# Patient Record
Sex: Male | Born: 1960 | Race: White | Hispanic: No | Marital: Single | Smoking: Never smoker
Health system: Southern US, Community
[De-identification: ages and names within clinical notes are randomized; demographics above are authoritative.]

## PROBLEM LIST (undated history)

## (undated) DIAGNOSIS — I10 Essential (primary) hypertension: Secondary | ICD-10-CM

## (undated) DIAGNOSIS — K219 Gastro-esophageal reflux disease without esophagitis: Secondary | ICD-10-CM

## (undated) DIAGNOSIS — J8489 Other specified interstitial pulmonary diseases: Secondary | ICD-10-CM

## (undated) DIAGNOSIS — J449 Chronic obstructive pulmonary disease, unspecified: Secondary | ICD-10-CM

## (undated) HISTORY — PX: CHOLECYSTECTOMY: SHX55

---

## 2017-06-17 ENCOUNTER — Encounter (HOSPITAL_BASED_OUTPATIENT_CLINIC_OR_DEPARTMENT_OTHER): Payer: Self-pay | Admitting: Emergency Medicine

## 2017-06-17 ENCOUNTER — Emergency Department (HOSPITAL_BASED_OUTPATIENT_CLINIC_OR_DEPARTMENT_OTHER)
Admission: EM | Admit: 2017-06-17 | Discharge: 2017-06-17 | Disposition: A | Discharge status: Home / Self Care | Payer: Self-pay | Attending: Emergency Medicine | Admitting: Emergency Medicine

## 2017-06-17 ENCOUNTER — Emergency Department (HOSPITAL_BASED_OUTPATIENT_CLINIC_OR_DEPARTMENT_OTHER): Payer: Self-pay

## 2017-06-17 DIAGNOSIS — R0789 Other chest pain: Secondary | ICD-10-CM | POA: Insufficient documentation

## 2017-06-17 DIAGNOSIS — I10 Essential (primary) hypertension: Secondary | ICD-10-CM | POA: Insufficient documentation

## 2017-06-17 DIAGNOSIS — J449 Chronic obstructive pulmonary disease, unspecified: Secondary | ICD-10-CM | POA: Insufficient documentation

## 2017-06-17 HISTORY — DX: Chronic obstructive pulmonary disease, unspecified: J44.9

## 2017-06-17 HISTORY — DX: Other specified interstitial pulmonary diseases: J84.89

## 2017-06-17 HISTORY — DX: Essential (primary) hypertension: I10

## 2017-06-17 HISTORY — DX: Gastro-esophageal reflux disease without esophagitis: K21.9

## 2017-06-17 LAB — BASIC METABOLIC PANEL
Anion gap: 9 (ref 5–15)
BUN: 18 mg/dL (ref 6–20)
CHLORIDE: 105 mmol/L (ref 101–111)
CO2: 26 mmol/L (ref 22–32)
Calcium: 9.6 mg/dL (ref 8.9–10.3)
Creatinine, Ser: 1.07 mg/dL (ref 0.61–1.24)
GFR calc Af Amer: >60 mL/min (ref >60)
GFR calc non Af Amer: >60 mL/min (ref >60)
Glucose, Bld: 111 mg/dL — ABNORMAL HIGH (ref 65–99)
Potassium: 3.6 mmol/L (ref 3.5–5.1)
SODIUM: 140 mmol/L (ref 135–145)

## 2017-06-17 LAB — CBC
HCT: 40.5 % (ref 39.0–52.0)
Hemoglobin: 13.1 g/dL (ref 13.0–17.0)
MCH: 30.6 pg (ref 26.0–34.0)
MCHC: 32.3 g/dL (ref 30.0–36.0)
MCV: 94.6 fL (ref 78.0–100.0)
Platelets: 297 10*3/uL (ref 150–400)
RBC: 4.28 MIL/uL (ref 4.22–5.81)
RDW: 14 % (ref 11.5–15.5)
WBC: 11.9 10*3/uL — AB (ref 4.0–10.5)

## 2017-06-17 LAB — TROPONIN I
Troponin I: <0.03 ng/mL (ref <0.03)
Troponin I: <0.03 ng/mL (ref <0.03)

## 2017-06-17 NOTE — Discharge Instructions (Signed)
Please follow up with your doctor °Return for worsening symptoms °

## 2017-06-17 NOTE — ED Notes (Signed)
Patient transported to X-ray 

## 2017-06-17 NOTE — ED Triage Notes (Signed)
Patient reports chest pain that began 30 minutes PTA.  Reports the pain as sharp in the middle of his chest.  States that he was sitting on the cough when this occurred.  Denies nausea, shortness of breath.

## 2017-06-17 NOTE — ED Notes (Signed)
Pt is unsure of what medications he takes at home, "five pills, I can't remember the names..." pt wife states she will go home and get pt medications after they talk to md.

## 2017-06-17 NOTE — ED Provider Notes (Signed)
[facility]   CSN: [id number] Arrival date & time: 06/17/17  1753     History   Chief Complaint Chief Complaint  Patient presents with  . Chest Pain    HPI Kyle Sosa is a 56 y.o. male who presents with chest pain. PMH significant for COPD, GERD, HTN, hx of BOOP, hypothyrodism. He states that he had an acute onset of chest pain today at 5:30PM. He states that he was sitting on the couch when this occurred. It is sharp, non-radiating, and over the left side of his chest. He has had chest pain before but never this severe. He denies fever, chills, syncope, palpitations, leg swelling, SOB, abdominal pain, N/V. He does have occasional cough and is on chronic steroids. He denies hx of cigarette smoking or drug use.  HPI  Past Medical History:  Diagnosis Date  . BOOP (bronchiolitis obliterans with organizing pneumonia) (HCC)   . COPD (chronic obstructive pulmonary disease) (HCC)   . GERD (gastroesophageal reflux disease)   . Hypertension     There are no active problems to display for this patient.   Past Surgical History:  Procedure Laterality Date  . CHOLECYSTECTOMY         Home Medications    Prior to Admission medications   Not on File    Family History History reviewed. No pertinent family history.  Social History Social History  Substance Use Topics  . Smoking status: Never Smoker  . Smokeless tobacco: Never Used  . Alcohol use No     Allergies   Patient has no known allergies.   Review of Systems Review of Systems  Constitutional: Negative for chills and fever.  Respiratory: Positive for cough and wheezing. Negative for shortness of breath.   Cardiovascular: Positive for chest pain. Negative for palpitations and leg swelling.  Gastrointestinal: Negative for abdominal pain, nausea and vomiting.  Neurological: Negative for syncope, weakness and light-headedness.  All other systems reviewed and are  negative.    Physical Exam Updated Vital Signs BP 139/88 (BP Location: Right Arm)   Pulse 87   Temp (!) 97.1 F (36.2 C) (Oral)   Resp 18   Ht  (1.88 m)   Wt 99.8 kg (220 lb)   SpO2 100%   BMI 28.25 kg/m   Physical Exam  Constitutional: He is oriented to person, place, and time. He appears well-developed and well-nourished. No distress.  HENT:  Head: Normocephalic and atraumatic.  Eyes: Pupils are equal, round, and reactive to light. Conjunctivae are normal. Right eye exhibits no discharge. Left eye exhibits no discharge. No scleral icterus.  Neck: Normal range of motion.  Cardiovascular: Normal rate and regular rhythm.  Exam reveals no gallop and no friction rub.   No murmur heard. Pulmonary/Chest: Effort normal and breath sounds normal. No respiratory distress. He has no wheezes. He has no rales. He exhibits tenderness (left sided chest wall tenderness).  Abdominal: Soft. Bowel sounds are normal. He exhibits no distension and no mass. There is no tenderness. There is no rebound and no guarding. No hernia.  Neurological: He is alert and oriented to person, place, and time.  Skin: Skin is warm and dry.  Psychiatric: He has a normal mood and affect. His behavior is normal.  Nursing note and vitals reviewed.    ED Treatments / Results  Labs (all labs ordered are listed, but only abnormal results are displayed) Labs Reviewed  BASIC METABOLIC PANEL - Abnormal; Notable for the following:  Result Value   Glucose, Bld 111 (*)    All other components within normal limits  CBC - Abnormal; Notable for the following:    WBC 11.9 (*)    All other components within normal limits  TROPONIN I    EKG  EKG Interpretation  Date/Time:  Wednesday June 17 2017 17:58:04 EDT Ventricular Rate:  88 PR Interval:    QRS Duration: 86 QT Interval:  378 QTC Calculation: 458 R Axis:   58 Text Interpretation:  Sinus rhythm Abnormal R-wave progression, early transition No old  tracing to compare Confirmed by Pricilla Loveless [phone]) on 06/17/2017 6:13:39 PM       Radiology No results found.  Procedures Procedures (including critical care time)  Medications Ordered in ED Medications - No data to display   Initial Impression / Assessment and Plan / ED Course  I have reviewed the triage vital signs and the nursing notes.  Pertinent labs & imaging results that were available during my care of the patient were reviewed by me and considered in my medical decision making (see chart for details).  Chest pain work up is reassuring. Doubt ACS, PE, pericarditis, esophageal rupture, tension pneumothorax, aortic dissection, cardiac tamponade. EKG is NSR. CXR is negative. Initial and second troponin is 0. Labs are unremarkable. Patient is non-smoker. HEART score is 2. Pain is resolved. Advised to f/u with PCP and return for worsening symptoms.  Final Clinical Impressions(s) / ED Diagnoses   Final diagnoses:  Chest wall pain    New Prescriptions New Prescriptions   No medications on file     Beryle Quant 06/18/17 0009    Pricilla Loveless, MD 06/18/17 [phone]

## 2018-04-02 IMAGING — CR DG CHEST 2V
2 series · 2 of 2 positions shown · non-contrast
Comparison: None.

CLINICAL DATA: Chest pain

EXAM:
CHEST  2 VIEW

[w chest pa]
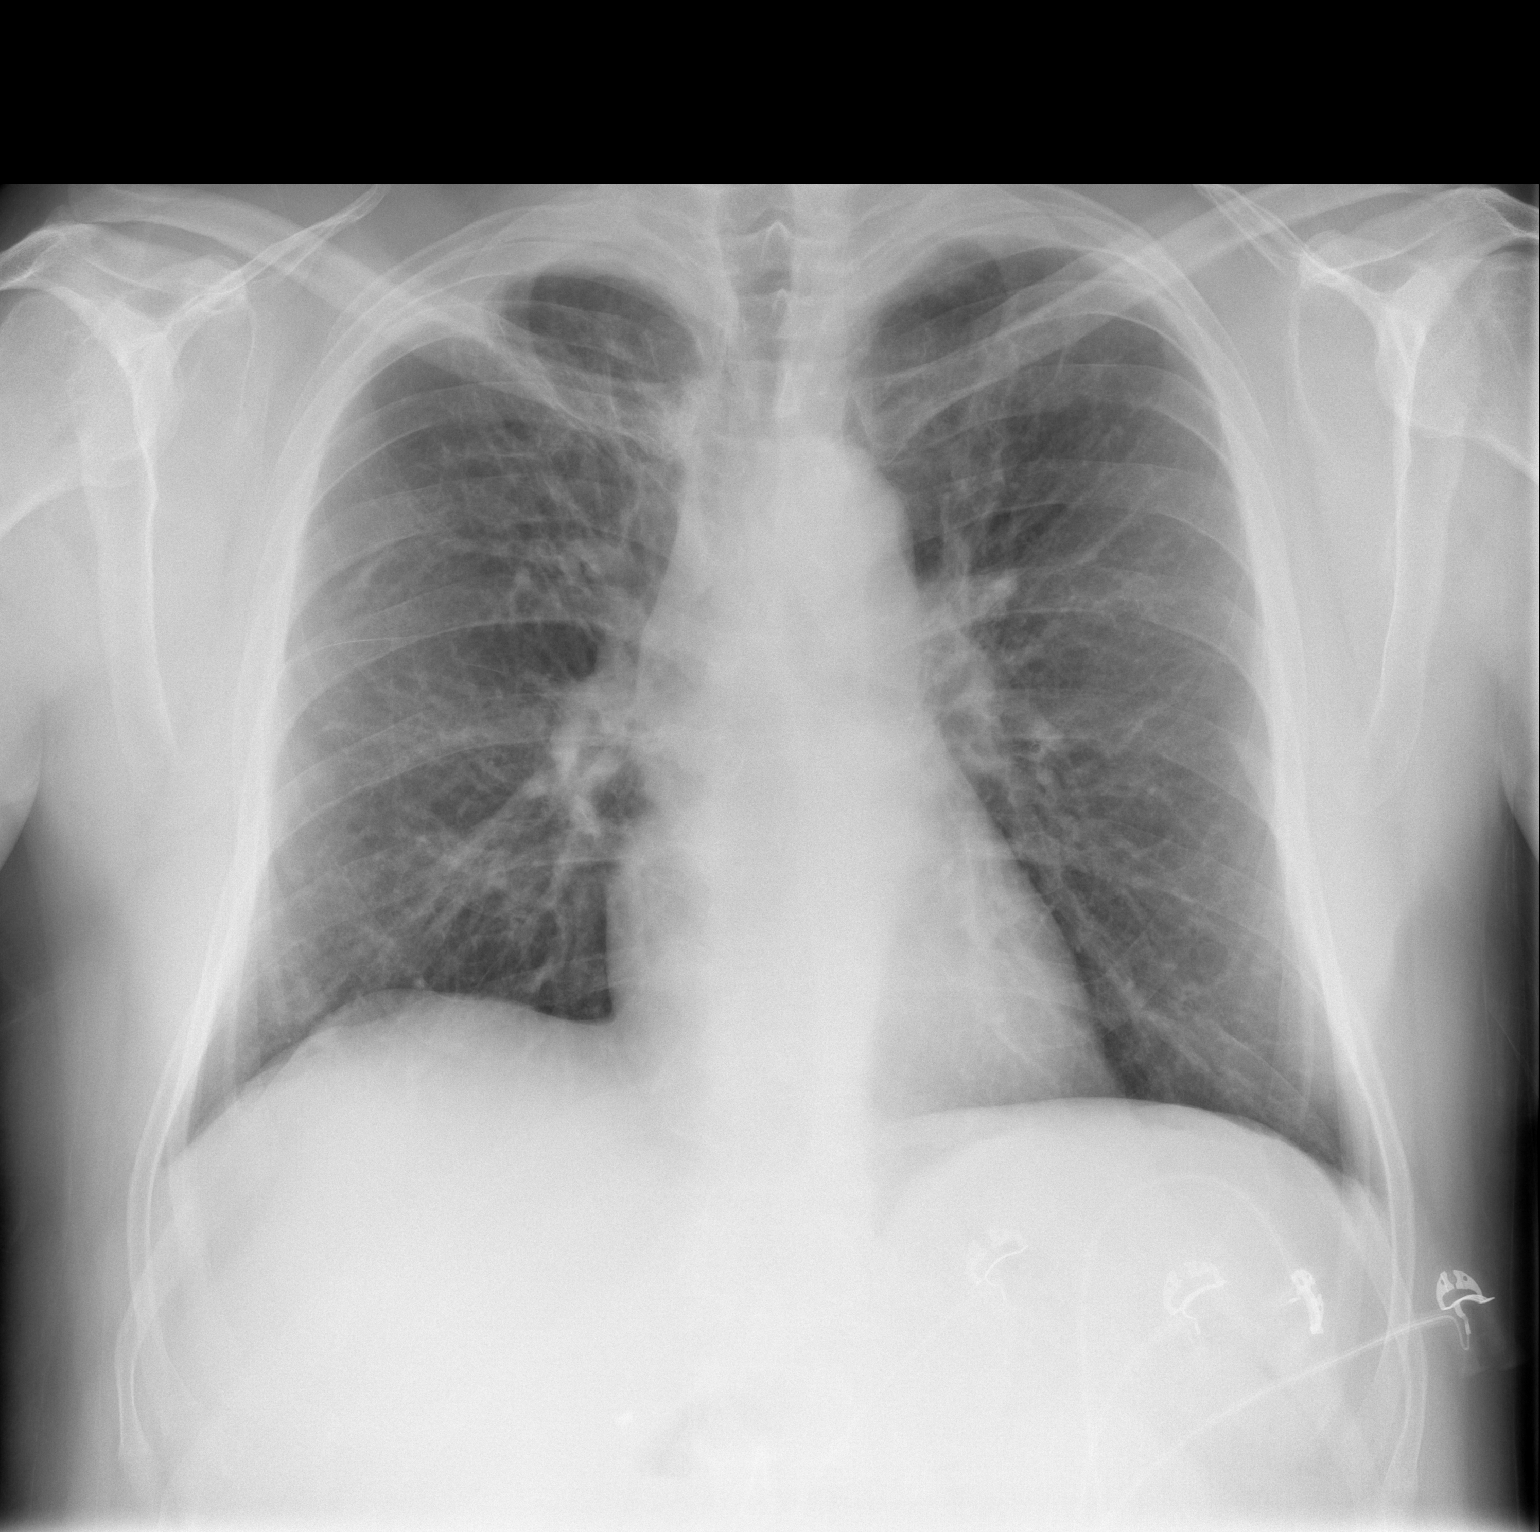

[w chest lat]
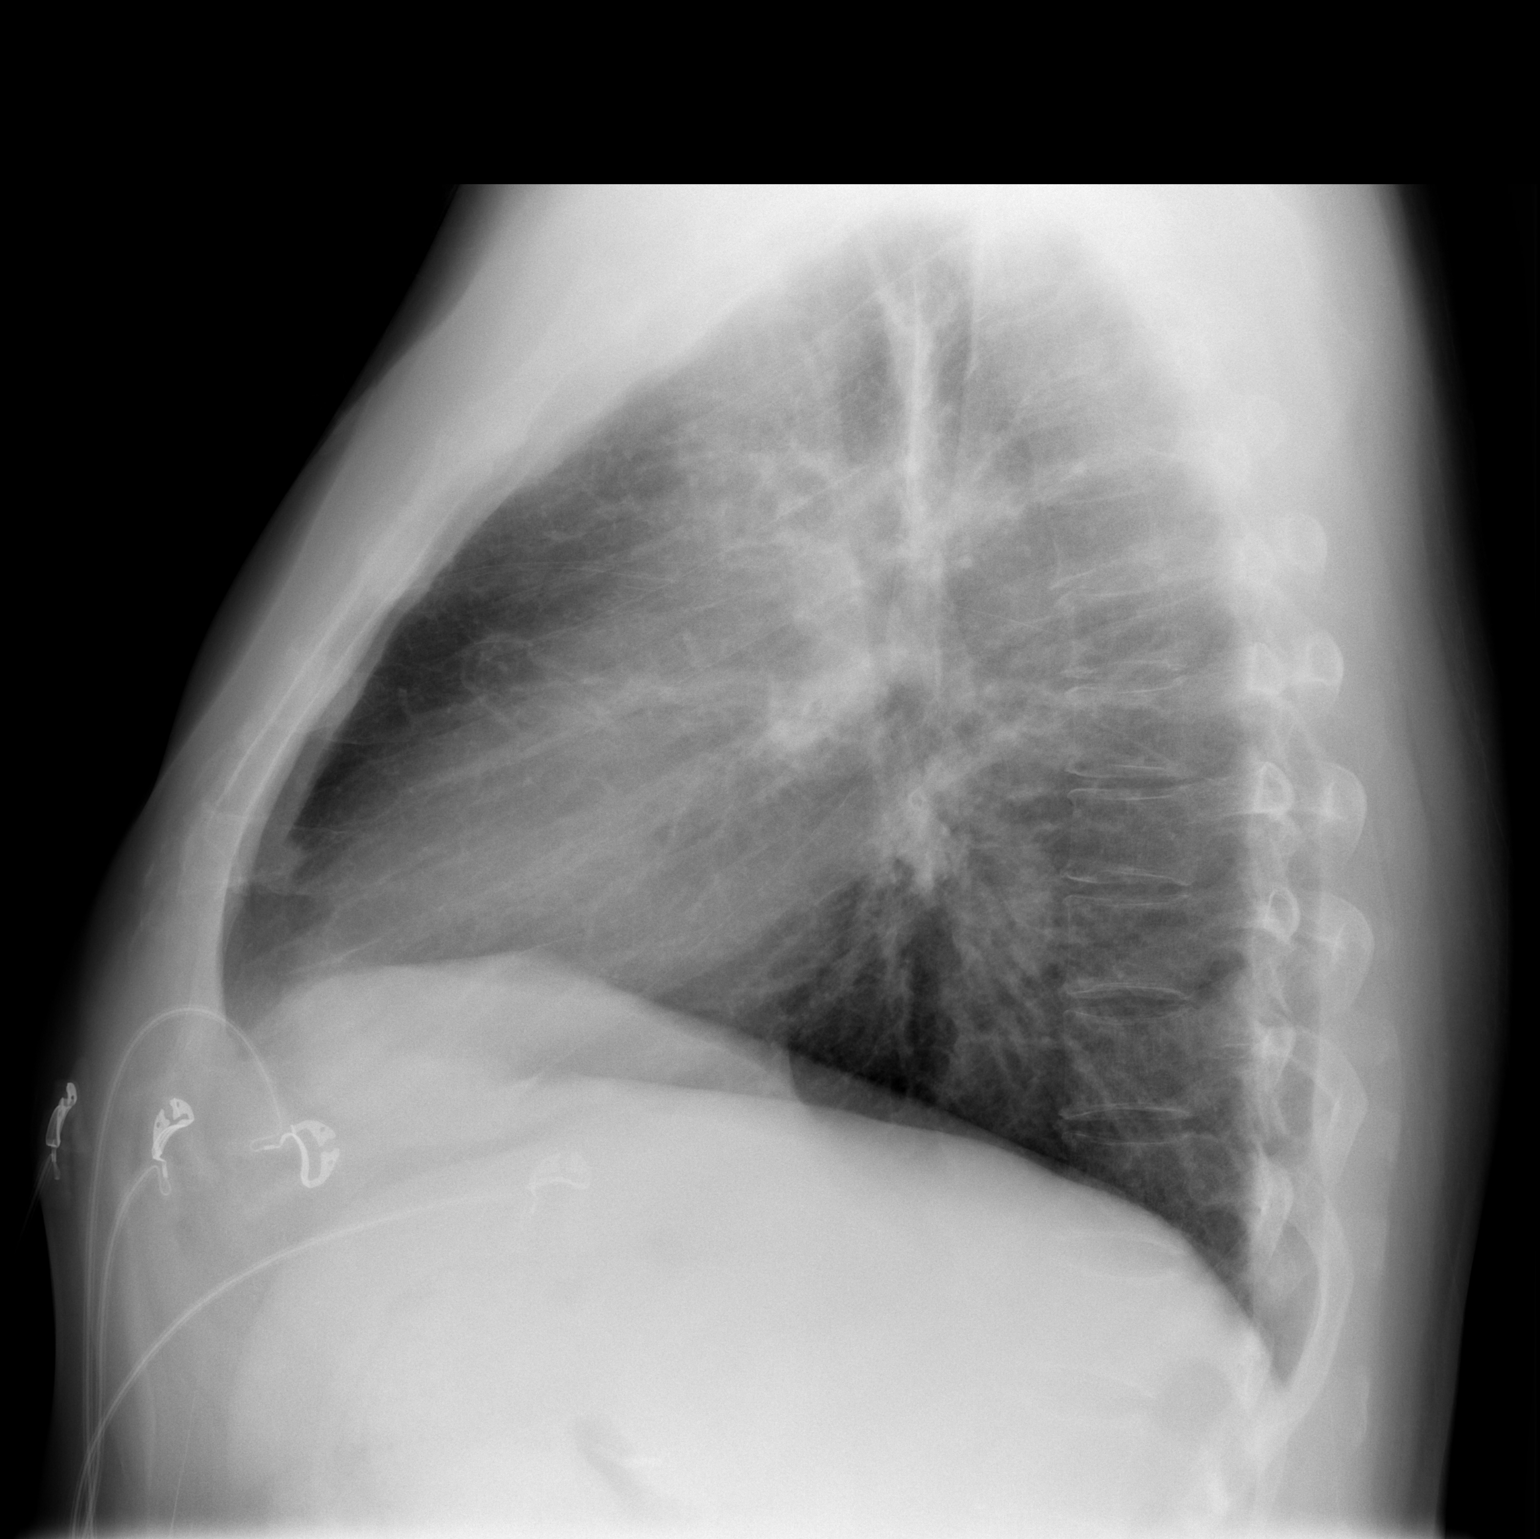

[2 of 2 positions shown; findings below may reference images not displayed]

FINDINGS: The heart size and mediastinal contours are within normal limits.
Both lungs are clear. The visualized skeletal structures are
unremarkable.
IMPRESSION: No active cardiopulmonary disease.

## 2023-02-26 ENCOUNTER — Encounter (HOSPITAL_BASED_OUTPATIENT_CLINIC_OR_DEPARTMENT_OTHER): Payer: Self-pay | Admitting: Emergency Medicine

## 2023-02-26 ENCOUNTER — Other Ambulatory Visit: Payer: Self-pay

## 2023-02-26 ENCOUNTER — Emergency Department (HOSPITAL_BASED_OUTPATIENT_CLINIC_OR_DEPARTMENT_OTHER)
Admission: EM | Admit: 2023-02-26 | Discharge: 2023-02-26 | Disposition: A | Discharge status: Home / Self Care | Payer: Medicaid Other | Attending: Emergency Medicine | Admitting: Emergency Medicine

## 2023-02-26 DIAGNOSIS — L03114 Cellulitis of left upper limb: Secondary | ICD-10-CM | POA: Insufficient documentation

## 2023-02-26 DIAGNOSIS — L02414 Cutaneous abscess of left upper limb: Secondary | ICD-10-CM | POA: Diagnosis present

## 2023-02-26 DIAGNOSIS — L0390 Cellulitis, unspecified: Secondary | ICD-10-CM

## 2023-02-26 MED ORDER — DOXYCYCLINE HYCLATE 100 MG PO CAPS: 100.0000 mg | ORAL_CAPSULE | Freq: Two times a day (BID) | ORAL | 0 refills | Status: AC

## 2023-02-26 MED ORDER — LIDOCAINE HCL 2 % IJ SOLN
15.0000 mL | Freq: Once | INTRAMUSCULAR | Status: AC
  Administered 2023-02-26: 300 mg
  Filled 2023-02-26: qty 20

## 2023-02-26 MED ORDER — CEFTRIAXONE SODIUM 1 G IJ SOLR
2.0000 g | Freq: Once | INTRAMUSCULAR | Status: AC
  Administered 2023-02-26: 2 g via INTRAMUSCULAR
  Filled 2023-02-26: qty 20

## 2023-02-26 MED ORDER — DOXYCYCLINE HYCLATE 100 MG PO TABS
100.0000 mg | ORAL_TABLET | Freq: Once | ORAL | Status: AC
  Administered 2023-02-26: 100 mg via ORAL
  Filled 2023-02-26: qty 1

## 2023-02-26 NOTE — Discharge Instructions (Signed)
Soak area 20 minutes every 4 hours for the next 2 days

## 2023-02-26 NOTE — ED Notes (Signed)
Pt. Has open wound noted on the inner L fore arm that is draining.  Pt. Has redness noted around the area.

## 2023-02-26 NOTE — ED Provider Notes (Signed)
[place] [facility] Provider Note   CSN: [id number] Arrival date & time: 02/26/23  1900     History  Chief Complaint  Patient presents with   Abscess    Kyle Sosa is a 61 y.o. male.  Kyle Sosa is a 62 year old male presents to the ED with left forearm abscess for 5 days. Patient states he believes an insect bit him but didn't feel a bite. Patient states it's "itchy and tender". Patient reports the "bite" progressed into an abscess that opened up and started to drain yesterday. Patient says this morning he noticed his arm, wrist and hand became swollen. Patient went to an urgent care and the provider advised pt to come to the ED due to the swelling. Patient denies fever, chills, body aches, N/V. Patient denies history of DM. Patient states he is up to date on his tetanus shot.   The history is provided by the patient. No language interpreter was used.  Abscess Associated symptoms: no fever        Home Medications Prior to Admission medications   Medication Sig Start Date End Date Taking? Authorizing Provider  doxycycline (VIBRAMYCIN) 100 MG capsule Take 1 capsule (100 mg total) by mouth 2 (two) times daily. 02/26/23  Yes Cheron Schaumann K, PA-C  folic acid (FOLVITE) 1 MG tablet Take 1 mg by mouth daily.    [provider]  hydrochlorothiazide (HYDRODIURIL) 25 MG tablet Take 25 mg by mouth daily.    [provider]  levothyroxine (SYNTHROID, LEVOTHROID) 137 MCG tablet Take 137 mcg by mouth daily before breakfast.    [provider]  methotrexate (RHEUMATREX) 2.5 MG tablet Take 15 mg by mouth once a week. Caution:Chemotherapy. Protect from light.    [provider]  predniSONE (DELTASONE) 5 MG tablet Take 7.5 mg by mouth daily with breakfast.    [provider]      Allergies    Patient has no known allergies.    Review of Systems   Review of Systems  Constitutional:  Negative for fever.   Neurological:  Negative for light-headedness.  All other systems reviewed and are negative.   Physical Exam Updated Vital Signs BP (!) 122/105 (BP Location: Left Arm)   Pulse 87   Temp 98.6 F (37 C) (Oral)   Resp 18   Ht 6\' 3"  (1.905 m)   Wt 95.3 kg   SpO2 96%   BMI 26.25 kg/m  Physical Exam Vitals and nursing note reviewed.  Constitutional:      Appearance: He is well-developed.  HENT:     Head: Normocephalic.  Cardiovascular:     Rate and Rhythm: Normal rate.  Pulmonary:     Effort: Pulmonary effort is normal.  Abdominal:     General: There is no distension.  Musculoskeletal:        General: Swelling and tenderness present.     Comments: 2cm swollen area left forearm. 10 cm area of erythema   Skin:    General: Skin is warm.  Neurological:     General: No focal deficit present.     Mental Status: He is alert and oriented to person, place, and time.     ED Results / Procedures / Treatments   Labs (all labs ordered are listed, but only abnormal results are displayed) Labs Reviewed - No data to display  EKG None  Radiology No results found.  Procedures Procedures    Medications Ordered in ED  Medications  lidocaine (XYLOCAINE) 2 % (with pres) injection 300 mg (300 mg Infiltration Given 02/26/23 2115)  cefTRIAXone (ROCEPHIN) injection 2 g (2 g Intramuscular Given 02/26/23 2115)  doxycycline (VIBRA-TABS) tablet 100 mg (100 mg Oral Given 02/26/23 2116)    ED Course/ Medical Decision Making/ A&P                             Medical Decision Making Pt complains of an abscess to his left forearm.  Pt reports area is draining  Risk Prescription drug management. Risk Details: Pt counseled on infection.  Pt given injection of Rocephin.  Pt given rx for doxycycline             Final Clinical Impression(s) / ED Diagnoses Final diagnoses:  Abscess of left forearm  Cellulitis, unspecified cellulitis site    Rx / DC Orders ED Discharge Orders           Ordered    doxycycline (VIBRAMYCIN) 100 MG capsule  2 times daily        02/26/23 2053           An After Visit Summary was printed and given to the patient.    Osie Cheeks 02/26/23 2149    Terald Sleeper, MD 02/27/23 1019

## 2023-02-26 NOTE — ED Triage Notes (Signed)
Patient arrived via POV c/o abscess to left forearm x 1 week . Patient think he was bitten by an insect. Patient seen by at [facility] today and sent here. Patient states tightness to forearm and hand. Patient is AO x 4, VS WDL, normal gait.

## 2023-09-30 ENCOUNTER — Other Ambulatory Visit: Payer: Self-pay

## 2023-09-30 ENCOUNTER — Emergency Department (HOSPITAL_BASED_OUTPATIENT_CLINIC_OR_DEPARTMENT_OTHER)
Admission: EM | Admit: 2023-09-30 | Discharge: 2023-09-30 | Disposition: A | Discharge status: Home / Self Care | Payer: Medicaid Other | Attending: Emergency Medicine | Admitting: Emergency Medicine

## 2023-09-30 ENCOUNTER — Encounter (HOSPITAL_BASED_OUTPATIENT_CLINIC_OR_DEPARTMENT_OTHER): Payer: Self-pay | Admitting: Emergency Medicine

## 2023-09-30 ENCOUNTER — Emergency Department (HOSPITAL_BASED_OUTPATIENT_CLINIC_OR_DEPARTMENT_OTHER): Payer: Medicaid Other

## 2023-09-30 DIAGNOSIS — Z20822 Contact with and (suspected) exposure to covid-19: Secondary | ICD-10-CM | POA: Insufficient documentation

## 2023-09-30 DIAGNOSIS — Z79899 Other long term (current) drug therapy: Secondary | ICD-10-CM | POA: Insufficient documentation

## 2023-09-30 DIAGNOSIS — J441 Chronic obstructive pulmonary disease with (acute) exacerbation: Secondary | ICD-10-CM | POA: Insufficient documentation

## 2023-09-30 DIAGNOSIS — Z7722 Contact with and (suspected) exposure to environmental tobacco smoke (acute) (chronic): Secondary | ICD-10-CM | POA: Insufficient documentation

## 2023-09-30 DIAGNOSIS — I10 Essential (primary) hypertension: Secondary | ICD-10-CM | POA: Insufficient documentation

## 2023-09-30 DIAGNOSIS — R509 Fever, unspecified: Secondary | ICD-10-CM | POA: Diagnosis present

## 2023-09-30 DIAGNOSIS — J101 Influenza due to other identified influenza virus with other respiratory manifestations: Secondary | ICD-10-CM | POA: Diagnosis not present

## 2023-09-30 LAB — CBC WITH DIFFERENTIAL/PLATELET
Abs Immature Granulocytes: 0.01 10*3/uL (ref 0.00–0.07)
Basophils Absolute: 0 10*3/uL (ref 0.0–0.1)
Basophils Relative: 1 %
Eosinophils Absolute: 0.3 10*3/uL (ref 0.0–0.5)
Eosinophils Relative: 4 %
HCT: 44.1 % (ref 39.0–52.0)
Hemoglobin: 14 g/dL (ref 13.0–17.0)
Immature Granulocytes: 0 %
Lymphocytes Relative: 27 %
Lymphs Abs: 1.8 10*3/uL (ref 0.7–4.0)
MCH: 29 pg (ref 26.0–34.0)
MCHC: 31.7 g/dL (ref 30.0–36.0)
MCV: 91.5 fL (ref 80.0–100.0)
Monocytes Absolute: 0.9 10*3/uL (ref 0.1–1.0)
Monocytes Relative: 14 %
Neutro Abs: 3.5 10*3/uL (ref 1.7–7.7)
Neutrophils Relative %: 54 %
Platelets: 289 10*3/uL (ref 150–400)
RBC: 4.82 MIL/uL (ref 4.22–5.81)
RDW: 13.6 % (ref 11.5–15.5)
WBC: 6.5 10*3/uL (ref 4.0–10.5)
nRBC: 0 % (ref 0.0–0.2)

## 2023-09-30 LAB — BASIC METABOLIC PANEL
Anion gap: 9 (ref 5–15)
BUN: 16 mg/dL (ref 8–23)
CO2: 25 mmol/L (ref 22–32)
Calcium: 8.8 mg/dL — ABNORMAL LOW (ref 8.9–10.3)
Chloride: 101 mmol/L (ref 98–111)
Creatinine, Ser: 1.14 mg/dL (ref 0.61–1.24)
GFR, Estimated: >60 mL/min (ref >60)
Glucose, Bld: 120 mg/dL — ABNORMAL HIGH (ref 70–99)
Potassium: 3.3 mmol/L — ABNORMAL LOW (ref 3.5–5.1)
Sodium: 135 mmol/L (ref 135–145)

## 2023-09-30 LAB — RESP PANEL BY RT-PCR (RSV, FLU A&B, COVID)  RVPGX2
Influenza A by PCR: POSITIVE — AB
Influenza B by PCR: NEGATIVE
Resp Syncytial Virus by PCR: NEGATIVE
SARS Coronavirus 2 by RT PCR: NEGATIVE

## 2023-09-30 MED ORDER — METHYLPREDNISOLONE SODIUM SUCC 125 MG IJ SOLR
125.0000 mg | Freq: Once | INTRAMUSCULAR | Status: AC
  Administered 2023-09-30: 125 mg via INTRAVENOUS
  Filled 2023-09-30: qty 2

## 2023-09-30 MED ORDER — PREDNISONE 50 MG PO TABS: 50.0000 mg | ORAL_TABLET | Freq: Every day | ORAL | 0 refills | Status: AC

## 2023-09-30 MED ORDER — OSELTAMIVIR PHOSPHATE 75 MG PO CAPS: 75.0000 mg | ORAL_CAPSULE | Freq: Two times a day (BID) | ORAL | 0 refills | Status: AC

## 2023-09-30 MED ORDER — GUAIFENESIN-CODEINE 100-10 MG/5ML PO SOLN: 5.0000 mL | ORAL | 0 refills | Status: AC | PRN

## 2023-09-30 MED ORDER — IPRATROPIUM-ALBUTEROL 0.5-2.5 (3) MG/3ML IN SOLN
3.0000 mL | Freq: Once | RESPIRATORY_TRACT | Status: AC
  Administered 2023-09-30: 3 mL via RESPIRATORY_TRACT
  Filled 2023-09-30: qty 3

## 2023-09-30 MED ORDER — KETOROLAC TROMETHAMINE 30 MG/ML IJ SOLN
30.0000 mg | Freq: Once | INTRAMUSCULAR | Status: AC
  Administered 2023-09-30: 30 mg via INTRAVENOUS
  Filled 2023-09-30: qty 1

## 2023-09-30 NOTE — ED Provider Notes (Signed)
 [place] [facility] Provider Note   CSN: [id number] Arrival date & time: 09/30/23  9089     History  Chief Complaint  Patient presents with   URI    Kyle Sosa is a 63 y.o. male.  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  Pt no longer smokes, but his girlfriend does and he smells strongly of smoke.  Pt said he's had sx for the past few days.  He feels diffusely achy and has had low grade fevers.  He's been taking tylenol for fever and took a dose this am.         Home Medications Prior to Admission medications   Medication Sig Start Date End Date Taking? Authorizing Provider  guaiFENesin -codeine  100-10 MG/5ML syrup Take 5 mLs by mouth every 4 (four) hours as needed for cough. 09/30/23  Yes Dean Clarity, MD  oseltamivir  (TAMIFLU ) 75 MG capsule Take 1 capsule (75 mg total) by mouth every 12 (twelve) hours. 09/30/23  Yes Dean Clarity, MD  predniSONE  (DELTASONE ) 50 MG tablet Take 1 tablet (50 mg total) by mouth daily with breakfast. 09/30/23  Yes Dean Clarity, MD  doxycycline  (VIBRAMYCIN ) 100 MG capsule Take 1 capsule (100 mg total) by mouth 2 (two) times daily. 02/26/23   Sofia, Leslie K, PA-C  folic acid (FOLVITE) 1 MG tablet Take 1 mg by mouth daily.    [provider]  hydrochlorothiazide (HYDRODIURIL) 25 MG tablet Take 25 mg by mouth daily.    [provider]  levothyroxine (SYNTHROID, LEVOTHROID) 137 MCG tablet Take 137 mcg by mouth daily before breakfast.    [provider]  methotrexate (RHEUMATREX) 2.5 MG tablet Take 15 mg by mouth once a week. Caution:Chemotherapy. Protect from light.    [provider]  predniSONE  (DELTASONE ) 5 MG tablet Take 7.5 mg by mouth daily with breakfast.    [provider]      Allergies    Patient has no known allergies.    Review of Systems   Review of Systems   Respiratory:  Positive for cough, shortness of breath and wheezing.   All other systems reviewed and are negative.   Physical Exam Updated Vital Signs BP 132/83   Pulse 88   Temp 98.2 F (36.8 C) (Oral)   Resp (!) 22   Wt 95.3 kg   SpO2 94% Comment: while ambulating. no complaints.  BMI 26.25 kg/m  Physical Exam Vitals and nursing note reviewed.  Constitutional:      Appearance: Normal appearance.  HENT:     Head: Normocephalic and atraumatic.     Right Ear: External ear normal.     Left Ear: External ear normal.     Nose: Nose normal.     Mouth/Throat:     Mouth: Mucous membranes are moist.     Pharynx: Oropharynx is clear.  Eyes:     Extraocular Movements: Extraocular movements intact.     Conjunctiva/sclera: Conjunctivae normal.     Pupils: Pupils are equal, round, and reactive to light.  Cardiovascular:     Rate and Rhythm: Normal rate and regular rhythm.     Pulses: Normal pulses.     Heart sounds: Normal heart sounds.  Pulmonary:     Breath sounds: Wheezing present.  Abdominal:     General: Abdomen is flat. Bowel sounds are normal.     Palpations: Abdomen is soft.  Musculoskeletal:        General: Normal range of motion.     Cervical back: Normal range of motion and neck supple.  Skin:    General: Skin is warm.     Capillary Refill: Capillary refill takes less than 2 seconds.  Neurological:     General: No focal deficit present.     Mental Status: He is alert and oriented to person, place, and time.  Psychiatric:        Mood and Affect: Mood normal.        Behavior: Behavior normal.     ED Results / Procedures / Treatments   Labs (all labs ordered are listed, but only abnormal results are displayed) Labs Reviewed  RESP PANEL BY RT-PCR (RSV, FLU A&B, COVID)  RVPGX2 - Abnormal; Notable for the following components:      Result Value   Influenza A by PCR POSITIVE (*)    All other components within normal limits  BASIC METABOLIC PANEL - Abnormal;  Notable for the following components:   Potassium 3.3 (*)    Glucose, Bld 120 (*)    Calcium 8.8 (*)    All other components within normal limits  CBC WITH DIFFERENTIAL/PLATELET    EKG None  Radiology DG Chest 2 View Result Date: 09/30/2023 CLINICAL DATA:  Cough.  Shortness of breath. EXAM: CHEST - 2 VIEW COMPARISON:  Radiograph from 01/12/2023 and chest CT scan from 09/25/2023. FINDINGS: Bilateral lungs appear hyperexpanded and hyperlucent with coarse bronchovascular markings, in keeping with COPD. There are additional nonspecific opacities throughout bilateral lungs, which corresponds to patchy area of atelectasis/scarring on the recent CT scan. Bilateral lungs otherwise appear clear. No dense consolidation or lung collapse. Bilateral costophrenic angles are clear. Normal cardio-mediastinal silhouette. No acute osseous abnormalities. The soft tissues are within normal limits. IMPRESSION: No active cardiopulmonary disease.  COPD. Electronically Signed   By: Ree Molt M.D.   On: 09/30/2023 09:57    Procedures Procedures    Medications Ordered in ED Medications  methylPREDNISolone  sodium succinate (SOLU-MEDROL ) 125 mg/2 mL injection 125 mg (125 mg Intravenous Given 09/30/23 0950)  ipratropium-albuterol  (DUONEB) 0.5-2.5 (3) MG/3ML nebulizer solution 3 mL (3 mLs Nebulization Given 09/30/23 0947)  ketorolac  (TORADOL ) 30 MG/ML injection 30 mg (30 mg Intravenous Given 09/30/23 9050)    ED Course/ Medical Decision Making/ A&P                                 Medical Decision Making Amount and/or Complexity of Data Reviewed Labs: ordered. Radiology: ordered.  Risk OTC drugs. Prescription drug management.   This patient presents to the ED for concern of cough, sob, this involves an extensive number of treatment options, and is a complaint that carries with it a high risk of complications and morbidity.  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  The pertinent results include:  cbc nl, bmp nl other than k sl low at 3.3, covid/rsv neg; influenza A +   Imaging Studies ordered:  I ordered imaging studies including cxr  I independently visualized and interpreted imaging which showed No active cardiopulmonary disease.  COPD.  I agree with the  radiologist interpretation   Cardiac Monitoring:  The patient was maintained on a cardiac monitor.  I personally viewed and interpreted the cardiac monitored which showed an underlying rhythm of: nsr   Medicines ordered and prescription drug management:  I ordered medication including solumedrol/duoneb  for sx  Reevaluation of the patient after these medicines showed that the patient improved I have reviewed the patients home medicines and have made adjustments as needed  Critical Interventions:  nebs   Problem List / ED Course:  Influenza A:  pt's O2 sat stays in the mid-90s with ambulation.  Cxr clear.  Pt is stable for d/c.  Return if worse.  F/u with pcp.   Reevaluation:  After the interventions noted above, I reevaluated the patient and found that they have :improved   Social Determinants of Health:  Lives at home   Dispostion:  After consideration of the diagnostic results and the patients response to treatment, I feel that the patent would benefit from discharge with outpatient f/u.          Final Clinical Impression(s) / ED Diagnoses Final diagnoses:  Influenza A  COPD exacerbation (HCC)  Second hand smoke exposure    Rx / DC Orders ED Discharge Orders          Ordered    oseltamivir  (TAMIFLU ) 75 MG capsule  Every 12 hours        09/30/23 1126    guaiFENesin -codeine  100-10  MG/5ML syrup  Every 4 hours PRN        09/30/23 1126    predniSONE  (DELTASONE ) 50 MG tablet  Daily with breakfast        09/30/23 1126              Dean Clarity, MD 09/30/23 1131

## 2023-09-30 NOTE — ED Triage Notes (Signed)
 URI x 3 days , cough and nasal congestion .  Adds diarrhea x 1 day .
